# Patient Record
Sex: Male | Born: 1990 | Hispanic: Refuse to answer | Marital: Single | Smoking: Never smoker
Health system: Southern US, Community
[De-identification: ages and names within clinical notes are randomized; demographics above are authoritative.]

## PROBLEM LIST (undated history)

## (undated) DIAGNOSIS — F32A Depression, unspecified: Secondary | ICD-10-CM

## (undated) DIAGNOSIS — F329 Major depressive disorder, single episode, unspecified: Secondary | ICD-10-CM

## (undated) HISTORY — DX: Major depressive disorder, single episode, unspecified: F32.9

## (undated) HISTORY — DX: Depression, unspecified: F32.A

---

## 2014-09-09 ENCOUNTER — Ambulatory Visit (INDEPENDENT_AMBULATORY_CARE_PROVIDER_SITE_OTHER): Payer: BLUE CROSS/BLUE SHIELD | Admitting: Family Medicine

## 2014-09-09 ENCOUNTER — Encounter: Payer: Self-pay | Admitting: Family Medicine

## 2014-09-09 VITALS — BP 135/85 | HR 73 | Ht 72.0 in | Wt 139.0 lb

## 2014-09-09 DIAGNOSIS — R0602 Shortness of breath: Secondary | ICD-10-CM | POA: Diagnosis not present

## 2014-09-09 DIAGNOSIS — M549 Dorsalgia, unspecified: Secondary | ICD-10-CM | POA: Insufficient documentation

## 2014-09-09 DIAGNOSIS — L989 Disorder of the skin and subcutaneous tissue, unspecified: Secondary | ICD-10-CM | POA: Insufficient documentation

## 2014-09-09 DIAGNOSIS — M546 Pain in thoracic spine: Secondary | ICD-10-CM

## 2014-09-09 NOTE — Patient Instructions (Signed)
Whey protein: 16g-32g every day within 30 minutes after workouts to increase lean muscle or right before bed to increase overall body mass.

## 2014-09-09 NOTE — Progress Notes (Signed)
CC: Scott [name] is a 23 y.o. male is here for Establish Care and upper back pain   Subjective: HPI:  Pleasant 23 year old here to establish care  Patient complains of upper back pain and thoracic region that has been present on a daily basis for the last 3-4 months. It seems to be worse when sitting for long periods of time in chairs with poor lumbar support. It seems to be worse if he's been leaning over for a long period of time. It does not radiate. Nothing particularly makes it better or worse other than that described above. He denies any recent or remote injury or overexertion. Denies midline pain. Denies any connection of pain to points in his breathing cycle. At its worst is mild to moderate in severity.  Complains of a lesion on his left posterior shoulder that has been present for a few months but possibly years. Over the past months it has been painful to the touch. No interventions as of yet. He is worried it might be cancer.  Complains of shortness of breath that occurs at an unknown frequency. When it occurs it lasted matter of minutes and resolves if he focuses on deep and controlled breathing. He believes it may have a connection to times of anxiety. He denies any exertional chest pain or exertional shortness of breath such as when working out or climbing a flight of stairs.  Review of Systems - General ROS: negative for - chills, fever, night sweats, weight gain or weight loss Ophthalmic ROS: negative for - decreased vision Psychological ROS: negative for - anxiety or depression ENT ROS: negative for - hearing change, nasal congestion, tinnitus or allergies Hematological and Lymphatic ROS: negative for - bleeding problems, bruising or swollen lymph nodes Breast ROS: negative Respiratory ROS: no cough,  wheezing Cardiovascular ROS: no chest pain or dyspnea on exertion Gastrointestinal ROS: no abdominal pain, change in bowel habits, or black or bloody stools Genito-Urinary  ROS: negative for - genital discharge, genital ulcers, incontinence or abnormal bleeding from genitals Musculoskeletal ROS: negative for - joint pain or muscle pain other than that described above Neurological ROS: negative for - headaches or memory loss Dermatological ROS: negative for lumps, mole changes, rash and skin lesion changes other than that described above  No past medical history on file.  No past surgical history on file. No family history on file.  History   Social History  . Marital Status: Single    Spouse Name: N/A    Number of Children: N/A  . Years of Education: N/A   Occupational History  . Not on file.   Social History Main Topics  . Smoking status: Never Smoker   . Smokeless tobacco: Not on file  . Alcohol Use: No  . Drug Use: No  . Sexual Activity: Not Currently   Other Topics Concern  . Not on file   Social History Narrative  . No narrative on file     Objective: BP 135/85 mmHg  Pulse 73  Ht 6' (1.829 m)  Wt 139 lb (63.05 kg)  BMI 18.85 kg/m2  General: Alert and Oriented, No Acute Distress HEENT: Pupils equal, round, reactive to light. Conjunctivae clear.  Moist mucous membranes pharynx unremarkable Lungs: Clear to auscultation bilaterally, no wheezing/ronchi/rales.  Comfortable work of breathing. Good air movement. Cardiac: Regular rate and rhythm. Normal S1/S2.  No murmurs, rubs, nor gallops.   Right shoulder exam reveals full range of motion and strength in all planes of motion and with  individual rotator cuff testing. No overlying redness warmth or swelling.  Neer's test negative.  Hawkins test negative. Empty can negative.Apprehension test negative.  Left shoulder exam reveals full range of motion and strength in all planes of motion and with individual rotator cuff testing. No overlying redness warmth or swelling.  Neer's test negative.  Hawkins test negative. Empty can negative.Apprehension test negative.  Back: No midline spinous process  tenderness in the thoracic region, pain is reproduced with palpation of the rhomboid muscles and with firing the rhomboid muscles for scapula adduction, no scapular winging Extremities: No peripheral edema.  Strong peripheral pulses.  Mental Status: No depression, anxiety, nor agitation. Skin: Warm and dry. Fleshy slightly raised mole on the left posterior shoulder slightly erythematous without any melanotic red flags  Assessment & Plan: Scott [name] was seen today for establish care and upper back pain.  Diagnoses and associated orders for this visit:  Skin lesion of back  Upper back pain  Shortness of breath    Skin lesion of the back: Discussed that this is a benign lesion and should not cause concern for any skin cancer. Since it is painful if he would ever like it to be destructive with cryotherapy the offers on the table so to speak, he politely declines today Upper back pain: Suspect rhomboid strain bilaterally and poor scapular stabilization bilaterally. He was given a home rehabilitation plan with exercises and a resistance band to be used on a daily basis for the next 3 weeks before returning to any upper extremity strength training Shortness of breath, reassurance provided no signs on his exam of significant cardiopulmonary disease.  Return in about 3 months (around 12/09/2014).

## 2015-09-28 HISTORY — PX: WISDOM TOOTH EXTRACTION: SHX21

## 2016-01-12 ENCOUNTER — Ambulatory Visit (INDEPENDENT_AMBULATORY_CARE_PROVIDER_SITE_OTHER): Payer: BLUE CROSS/BLUE SHIELD | Admitting: Family Medicine

## 2016-01-12 ENCOUNTER — Encounter: Payer: Self-pay | Admitting: Family Medicine

## 2016-01-12 VITALS — BP 113/72 | HR 58 | Wt 142.0 lb

## 2016-01-12 DIAGNOSIS — Z0000 Encounter for general adult medical examination without abnormal findings: Secondary | ICD-10-CM | POA: Diagnosis not present

## 2016-01-12 DIAGNOSIS — Z23 Encounter for immunization: Secondary | ICD-10-CM | POA: Diagnosis not present

## 2016-01-12 LAB — COMPLETE METABOLIC PANEL WITH GFR
ALBUMIN: 4.5 g/dL (ref 3.6–5.1)
ALK PHOS: 75 U/L (ref 40–115)
ALT: 63 U/L — AB (ref 9–46)
AST: 65 U/L — AB (ref 10–40)
BILIRUBIN TOTAL: 0.4 mg/dL (ref 0.2–1.2)
BUN: 25 mg/dL (ref 7–25)
CO2: 29 mmol/L (ref 20–31)
CREATININE: 0.96 mg/dL (ref 0.60–1.35)
Calcium: 9.4 mg/dL (ref 8.6–10.3)
Chloride: 102 mmol/L (ref 98–110)
GFR, Est African American: >89 mL/min (ref >=60)
GLUCOSE: 75 mg/dL (ref 65–99)
Potassium: 4.2 mmol/L (ref 3.5–5.3)
Sodium: 137 mmol/L (ref 135–146)
TOTAL PROTEIN: 7 g/dL (ref 6.1–8.1)

## 2016-01-12 LAB — CBC
HCT: 41.6 % (ref 38.5–50.0)
HEMOGLOBIN: 13.9 g/dL (ref 13.2–17.1)
MCH: 32.5 pg (ref 27.0–33.0)
MCHC: 33.4 g/dL (ref 32.0–36.0)
MCV: 97.2 fL (ref 80.0–100.0)
MPV: 10 fL (ref 7.5–12.5)
Platelets: 207 10*3/uL (ref 140–400)
RBC: 4.28 MIL/uL (ref 4.20–5.80)
RDW: 13.3 % (ref 11.0–15.0)
WBC: 4.5 10*3/uL (ref 3.8–10.8)

## 2016-01-12 LAB — LIPID PANEL
Cholesterol: 161 mg/dL (ref 125–200)
HDL: 99 mg/dL (ref >=40)
LDL CALC: 54 mg/dL (ref <130)
TRIGLYCERIDES: 41 mg/dL (ref <150)
Total CHOL/HDL Ratio: 1.6 Ratio (ref <=5.0)
VLDL: 8 mg/dL (ref <30)

## 2016-01-12 NOTE — Addendum Note (Signed)
Addended by: Thom ChimesHENRY, Fredericka Bottcher M on: 01/12/2016 11:35 AM   Modules accepted: Orders, SmartSet

## 2016-01-12 NOTE — Progress Notes (Signed)
CC: Scott DockerJonthan [name] is a 25 y.o. male is here for Annual Exam   Subjective: HPI:  Colonoscopy: no current indication Prostate: Discussed screening risks/beneifts with patient today, no current indication.   Influenza Vaccine: UTD Pneumovax: no current indication Td/Tdap: overdue, will receive Tdap today Zoster: (Start 26 yo)  Requesting complete physical exam with no acute complaints  Review of Systems - General ROS: negative for - chills, fever, night sweats, weight gain or weight loss Ophthalmic ROS: negative for - decreased vision Psychological ROS: negative for - anxiety or depression ENT ROS: negative for - hearing change, nasal congestion, tinnitus or allergies Hematological and Lymphatic ROS: negative for - bleeding problems, bruising or swollen lymph nodes Breast ROS: negative Respiratory ROS: no cough, shortness of breath, or wheezing Cardiovascular ROS: no chest pain or dyspnea on exertion Gastrointestinal ROS: no abdominal pain, change in bowel habits, or black or bloody stools Genito-Urinary ROS: negative for - genital discharge, genital ulcers, incontinence or abnormal bleeding from genitals Musculoskeletal ROS: negative for - joint pain or muscle pain Neurological ROS: negative for - headaches or memory loss Dermatological ROS: negative for lumps, mole changes, rash and skin lesion changes  No past medical history on file.  No past surgical history on file. Family History  Problem Relation Age of Onset  . Brain cancer Maternal Grandmother   . Depression Father   . Hyperlipidemia Paternal Grandfather   . Hypertension Paternal Grandfather   . Alzheimer's disease Paternal Grandfather     Social History   Social History  . Marital Status: Single    Spouse Name: N/A  . Number of Children: N/A  . Years of Education: N/A   Occupational History  . Not on file.   Social History Main Topics  . Smoking status: Never Smoker   . Smokeless tobacco: Not on file   . Alcohol Use: No  . Drug Use: No  . Sexual Activity: Not Currently   Other Topics Concern  . Not on file   Social History Narrative  . No narrative on file     Objective: BP 113/72 mmHg  Pulse 58  Wt 142 lb (64.411 kg)  General: No Acute Distress HEENT: Atraumatic, normocephalic, conjunctivae normal without scleral icterus.  No nasal discharge, hearing grossly intact, TMs with good landmarks bilaterally with no middle ear abnormalities, posterior pharynx clear without oral lesions. Neck: Supple, trachea midline, no cervical nor supraclavicular adenopathy. Pulmonary: Clear to auscultation bilaterally without wheezing, rhonchi, nor rales. Cardiac: Regular rate and rhythm.  No murmurs, rubs, nor gallops. No peripheral edema.  2+ peripheral pulses bilaterally. Abdomen: Bowel sounds normal.  No masses.  Non-tender without rebound.  Negative Murphy's sign. MSK: Grossly intact, no signs of weakness.  Full strength throughout upper and lower extremities.  Full ROM in upper and lower extremities.  No midline spinal tenderness. Neuro: Gait unremarkable, CN II-XII grossly intact.  C5-C6 Reflex 2/4 Bilaterally, L4 Reflex 2/4 Bilaterally.  Cerebellar function intact. Skin: No rashes. Benign-appearing moles over the left scapula and on the anterior torso. Psych: Alert and oriented to person/place/time.  Thought process normal. No anxiety/depression. Assessment & Plan: Scott BandyJonthan was seen today for annual exam.  Diagnoses and all orders for this visit:  Annual physical exam   Healthy lifestyle interventions including but not limited to regular exercise, a healthy low fat diet, moderation of salt intake, the dangers of tobacco/alcohol/recreational drug use, nutrition supplementation, and accident avoidance were discussed with the patient and a handout was provided for future reference.  Return in about 1 year (around 01/11/2017), or if symptoms worsen or fail to improve.

## 2016-01-12 NOTE — Patient Instructions (Signed)
Dr. Estreya Clay's General Advice Following Your Complete Physical Exam  The Benefits of Regular Exercise: Unless you suffer from an uncontrolled cardiovascular condition, studies strongly suggest that regular exercise and physical activity will add to both the quality and length of your life.  The World Health Organization recommends 150 minutes of moderate intensity aerobic activity every week.  This is best split over 3-4 days a week, and can be as simple as a brisk walk for just over 35 minutes "most days of the week".  This type of exercise has been shown to lower LDL-Cholesterol, lower average blood sugars, lower blood pressure, lower cardiovascular disease risk, improve memory, and increase one's overall sense of wellbeing.  The addition of anaerobic (or "strength training") exercises offers additional benefits including but not limited to increased metabolism, prevention of osteoporosis, and improved overall cholesterol levels.  How Can I Strive For A Low-Fat Diet?: Current guidelines recommend that 25-35 percent of your daily energy (food) intake should come from fats.  One might ask how can this be achieved without having to dissect each meal on a daily basis?  Switch to skim or 1% milk instead of whole milk.  Focus on lean meats such as ground turkey, fresh fish, baked chicken, and lean cuts of beef as your source of dietary protein.  Limit saturated fat consumption to less than 10% of your daily caloric intake.  Limit trans fatty acid consumption primarily by limiting synthetic trans fats such as partially hydrogenated oils (Ex: fried fast foods).  Substitute olive or vegetable oil for solid fats where possible.  Moderation of Salt Intake: Provided you don't carry a diagnosis of congestive heart failure nor renal failure, I recommend a daily allowance of no more than 2300 mg of salt (sodium).  Keeping under this daily goal is associated with a decreased risk of cardiovascular events, creeping  above it can lead to elevated blood pressures and increases your risk of cardiovascular events.  Milligrams (mg) of salt is listed on all nutrition labels, and your daily intake can add up faster than you think.  Most canned and frozen dinners can pack in over half your daily salt allowance in one meal.    Lifestyle Health Risks: Certain lifestyle choices carry specific health risks.  As you may already know, tobacco use has been associated with increasing one's risk of cardiovascular disease, pulmonary disease, numerous cancers, among many other issues.  What you may not know is that there are medications and nicotine replacement strategies that can more than double your chances of successfully quitting.  I would be thrilled to help manage your quitting strategy if you currently use tobacco products.  When it comes to alcohol use, I've yet to find an "ideal" daily allowance.  Provided an individual does not have a medical condition that is exacerbated by alcohol consumption, general guidelines determine "safe drinking" as no more than two standard drinks for a man or no more than one standard drink for a male per day.  However, much debate still exists on whether any amount of alcohol consumption is technically "safe".  My general advice, keep alcohol consumption to a minimum for general health promotion.  If you or others believe that alcohol, tobacco, or recreational drug use is interfering with your life, I would be happy to provide confidential counseling regarding treatment options.  General "Over The Counter" Nutrition Advice: Postmenopausal women should aim for a daily calcium intake of 1200 mg, however a significant portion of this might already be   provided by diets including milk, yogurt, cheese, and other dairy products.  Vitamin D has been shown to help preserve bone density, prevent fatigue, and has even been shown to help reduce falls in the elderly.  Ensuring a daily intake of 800 Units of  Vitamin D is a good place to start to enjoy the above benefits, we can easily check your Vitamin D level to see if you'd potentially benefit from supplementation beyond 800 Units a day.  Folic Acid intake should be of particular concern to women of childbearing age.  Daily consumption of 400-800 mcg of Folic Acid is recommended to minimize the chance of spinal cord defects in a fetus should pregnancy occur.    For many adults, accidents still remain one of the most common culprits when it comes to cause of death.  Some of the simplest but most effective preventitive habits you can adopt include regular seatbelt use, proper helmet use, securing firearms, and regularly testing your smoke and carbon monoxide detectors.  Scott Jacquin B. Ryland Smoots DO [facility] [address], [address] [place], [place] [address] Phone: [phone]  

## 2016-01-13 ENCOUNTER — Telehealth: Payer: Self-pay | Admitting: Family Medicine

## 2016-01-13 DIAGNOSIS — R945 Abnormal results of liver function studies: Principal | ICD-10-CM

## 2016-01-13 DIAGNOSIS — R7989 Other specified abnormal findings of blood chemistry: Secondary | ICD-10-CM

## 2016-01-13 NOTE — Telephone Encounter (Signed)
Results and recommendations left on vm pt advised to call with any questions  

## 2016-01-13 NOTE — Telephone Encounter (Signed)
Will you please let patient know that his cholesterol, kidney function, blood sugar, and blood cell counts were all normal.  His liver enzymes were slightly elevated reflecting some mild liver inflammation and I'd recommend that he have a repeat blood test to make sure this is not caused by a viral infection, lab slip in your in box.

## 2016-02-02 LAB — HEPATITIS PANEL, ACUTE
HCV Ab: NEGATIVE
HEP A IGM: NONREACTIVE
Hep B C IgM: NONREACTIVE
Hepatitis B Surface Ag: NEGATIVE

## 2016-02-29 ENCOUNTER — Ambulatory Visit (INDEPENDENT_AMBULATORY_CARE_PROVIDER_SITE_OTHER): Payer: BLUE CROSS/BLUE SHIELD | Admitting: Family Medicine

## 2016-02-29 ENCOUNTER — Encounter: Payer: Self-pay | Admitting: Family Medicine

## 2016-02-29 VITALS — BP 116/76 | HR 43 | Wt 141.0 lb

## 2016-02-29 DIAGNOSIS — R945 Abnormal results of liver function studies: Principal | ICD-10-CM

## 2016-02-29 DIAGNOSIS — R7989 Other specified abnormal findings of blood chemistry: Secondary | ICD-10-CM

## 2016-02-29 NOTE — Progress Notes (Signed)
CC: Scott [name] [name] is a 25 y.o. male is here for Hepatitis   Subjective: HPI:  Follow-up hepatitis: Fortunately an acute hepatitis panel was normal. He tells me he never drinks alcohol and cannot remember the last time he took Tylenol he takes so infrequently. He continues to have no abdominal pain, fevers, diarrhea or any gastrointestinal complaints. He tells me that when he had the original test done he had engaged in strenuous workout that morning. He denies any jaundice or abdominal discomfort  Review Of Systems Outlined In HPI  No past medical history on file.  No past surgical history on file. Family History  Problem Relation Age of Onset  . Brain cancer Maternal Grandmother   . Depression Father   . Hyperlipidemia Paternal Grandfather   . Hypertension Paternal Grandfather   . Alzheimer's disease Paternal Grandfather     Social History   Social History  . Marital Status: Single    Spouse Name: N/A  . Number of Children: N/A  . Years of Education: N/A   Occupational History  . Not on file.   Social History Main Topics  . Smoking status: Never Smoker   . Smokeless tobacco: Not on file  . Alcohol Use: No  . Drug Use: No  . Sexual Activity: Not Currently   Other Topics Concern  . Not on file   Social History Narrative     Objective: BP 116/76 mmHg  Pulse 43  Wt 141 lb (63.957 kg)  Vital signs reviewed. General: Alert and Oriented, No Acute Distress HEENT: Pupils equal, round, reactive to light. Conjunctivae clear.  External ears unremarkable.  Moist mucous membranes. Lungs: Clear and comfortable work of breathing, speaking in full sentences without accessory muscle use. Cardiac: Regular rate and rhythm.  Neuro: CN II-XII grossly intact, gait normal. Extremities: No peripheral edema.  Strong peripheral pulses.  Mental Status: No depression, anxiety, nor agitation. Logical though process. Skin: Warm and dry.  Assessment & Plan: Scott [name] was seen today for  hepatitis.  Diagnoses and all orders for this visit:  Elevated LFTs -     Hepatic function panel   Very possible that his exercise routine cause him to have elevated AST and ALT, we'll test this. Today with blood work ,he hasn't exercised since Saturday morning.  No Follow-up on file.

## 2016-03-01 ENCOUNTER — Telehealth: Payer: Self-pay | Admitting: Family Medicine

## 2016-03-01 DIAGNOSIS — R945 Abnormal results of liver function studies: Principal | ICD-10-CM

## 2016-03-01 DIAGNOSIS — R7989 Other specified abnormal findings of blood chemistry: Secondary | ICD-10-CM

## 2016-03-01 LAB — HEPATIC FUNCTION PANEL
ALBUMIN: 4.4 g/dL (ref 3.6–5.1)
ALK PHOS: 68 U/L (ref 40–115)
ALT: 62 U/L — ABNORMAL HIGH (ref 9–46)
AST: 34 U/L (ref 10–40)
BILIRUBIN DIRECT: 0.1 mg/dL (ref <=0.2)
BILIRUBIN INDIRECT: 0.3 mg/dL (ref 0.2–1.2)
BILIRUBIN TOTAL: 0.4 mg/dL (ref 0.2–1.2)
Total Protein: 6.9 g/dL (ref 6.1–8.1)

## 2016-03-01 MED ORDER — VITAMIN E 180 MG (400 UNIT) PO CAPS: 400.0000 [IU] | ORAL_CAPSULE | Freq: Every day | ORAL | Status: DC

## 2016-03-01 NOTE — Telephone Encounter (Signed)
Will you please let patient know that one of his two liver enzymes is in the normal range but the second one is still mildly elevated.  I'd recommend he start a daily vitamin E supplement at a dose of 400 units, this can be obtained OTC.  Please follow up in 2-3 months to recheck blood work.

## 2016-05-31 ENCOUNTER — Ambulatory Visit (INDEPENDENT_AMBULATORY_CARE_PROVIDER_SITE_OTHER): Payer: BLUE CROSS/BLUE SHIELD | Admitting: Family Medicine

## 2016-05-31 ENCOUNTER — Encounter: Payer: Self-pay | Admitting: Family Medicine

## 2016-05-31 VITALS — BP 102/66 | HR 56 | Wt 134.0 lb

## 2016-05-31 DIAGNOSIS — R7989 Other specified abnormal findings of blood chemistry: Secondary | ICD-10-CM | POA: Diagnosis not present

## 2016-05-31 DIAGNOSIS — R945 Abnormal results of liver function studies: Principal | ICD-10-CM

## 2016-05-31 NOTE — Progress Notes (Signed)
CC: Scott [name] is a 25 y.o. male is here for liver panel   Subjective: HPI:  Since I saw him last his been taking vitamin E on a daily basis. He denies any right upper quadrant pain, jaundice, nausea or decreased appetite. He continues to work out and [employer] weights most days out of the week. Denies any unintentional weight loss   Review Of Systems Outlined In HPI  No past medical history on file.  No past surgical history on file. Family History  Problem Relation Age of Onset  . Brain cancer Maternal Grandmother   . Depression Father   . Hyperlipidemia Paternal Grandfather   . Hypertension Paternal Grandfather   . Alzheimer's disease Paternal Grandfather     Social History   Social History  . Marital status: Single    Spouse name: N/A  . Number of children: N/A  . Years of education: N/A   Occupational History  . Not on file.   Social History Main Topics  . Smoking status: Never Smoker  . Smokeless tobacco: Not on file  . Alcohol use No  . Drug use: No  . Sexual activity: Not Currently   Other Topics Concern  . Not on file   Social History Narrative  . No narrative on file     Objective: BP 102/66   Pulse (!) 56   Wt 134 lb (60.8 kg)   BMI 18.17 kg/m   Vital signs reviewed. General: Alert and Oriented, No Acute Distress HEENT: Pupils equal, round, reactive to light. Conjunctivae clear.  External ears unremarkable.  Moist mucous membranes. Lungs: Clear and comfortable work of breathing, speaking in full sentences without accessory muscle use. Cardiac: Regular rate and rhythm.  Neuro: CN II-XII grossly intact, gait normal. Extremities: No peripheral edema.  Strong peripheral pulses.  Mental Status: No depression, anxiety, nor agitation. Logical though process. Skin: Warm and dry.  Assessment & Plan: Scott [name] was seen today for liver panel.  Diagnoses and all orders for this visit:  Elevated LFTs -     Hepatic function panel   Due for repeat  LFTs, continue vitamin E pending results  Discussed with this patient that I will be resigning from my position here with [facility] in September in order to stay with my [place]. I let him know about the providers that are still accepting patients and I feel that this individual will be under great care if he/she stays here with [facility]. He's decided to see Dr. Denyse Amassorey so that he can take advantage of Dr. Zollie Peeorey's expertise with sports medicine.   Return for Within the month to see Dr. Denyse Amassorey.

## 2016-06-01 ENCOUNTER — Telehealth: Payer: Self-pay | Admitting: Family Medicine

## 2016-06-01 DIAGNOSIS — R945 Abnormal results of liver function studies: Principal | ICD-10-CM

## 2016-06-01 DIAGNOSIS — R7989 Other specified abnormal findings of blood chemistry: Secondary | ICD-10-CM

## 2016-06-01 LAB — HEPATIC FUNCTION PANEL
ALT: 84 U/L — AB (ref 9–46)
AST: 46 U/L — AB (ref 10–40)
Albumin: 4.4 g/dL (ref 3.6–5.1)
Alkaline Phosphatase: 55 U/L (ref 40–115)
BILIRUBIN DIRECT: 0.1 mg/dL (ref <=0.2)
Indirect Bilirubin: 0.4 mg/dL (ref 0.2–1.2)
Total Bilirubin: 0.5 mg/dL (ref 0.2–1.2)
Total Protein: 6.8 g/dL (ref 6.1–8.1)

## 2016-06-01 NOTE — Telephone Encounter (Signed)
Pt notified and transferred to imaging  

## 2016-06-01 NOTE — Telephone Encounter (Signed)
Will you please let patient know that his liver enzymes have not improved and it seems the Vitamin E is doing nothing for him, he may as well stop taking it.  I'd like to get an ultrasound of his liver which is part of the standard continued workup for this situation.  An order was sent to radiology.

## 2016-06-06 ENCOUNTER — Encounter: Payer: Self-pay | Admitting: Gastroenterology

## 2016-06-06 ENCOUNTER — Telehealth: Payer: Self-pay | Admitting: Family Medicine

## 2016-06-06 ENCOUNTER — Ambulatory Visit (INDEPENDENT_AMBULATORY_CARE_PROVIDER_SITE_OTHER): Payer: BLUE CROSS/BLUE SHIELD

## 2016-06-06 DIAGNOSIS — R945 Abnormal results of liver function studies: Principal | ICD-10-CM

## 2016-06-06 DIAGNOSIS — R7989 Other specified abnormal findings of blood chemistry: Secondary | ICD-10-CM | POA: Diagnosis not present

## 2016-06-06 NOTE — Telephone Encounter (Signed)
Will you please let patient know that his ultrasound was reassuring and did not show any abnormality.  Since I can't find the source of his elevated enzymes I'm going to refer him to a gastroenterologist.  Please let me know if not contacted by the end of the week.

## 2016-06-06 NOTE — Telephone Encounter (Signed)
Pt.notified

## 2016-07-18 ENCOUNTER — Ambulatory Visit: Payer: BLUE CROSS/BLUE SHIELD | Admitting: Gastroenterology

## 2016-08-22 ENCOUNTER — Ambulatory Visit (INDEPENDENT_AMBULATORY_CARE_PROVIDER_SITE_OTHER): Payer: BLUE CROSS/BLUE SHIELD | Admitting: Gastroenterology

## 2016-08-22 ENCOUNTER — Other Ambulatory Visit (INDEPENDENT_AMBULATORY_CARE_PROVIDER_SITE_OTHER): Payer: BLUE CROSS/BLUE SHIELD

## 2016-08-22 ENCOUNTER — Encounter: Payer: Self-pay | Admitting: Gastroenterology

## 2016-08-22 VITALS — BP 110/56 | HR 56 | Ht 70.25 in | Wt 128.0 lb

## 2016-08-22 DIAGNOSIS — R7989 Other specified abnormal findings of blood chemistry: Secondary | ICD-10-CM

## 2016-08-22 DIAGNOSIS — R945 Abnormal results of liver function studies: Principal | ICD-10-CM

## 2016-08-22 DIAGNOSIS — R350 Frequency of micturition: Secondary | ICD-10-CM

## 2016-08-22 LAB — CBC WITH DIFFERENTIAL/PLATELET
BASOS ABS: 0 10*3/uL (ref 0.0–0.1)
Basophils Relative: 0.5 % (ref 0.0–3.0)
EOS ABS: 0 10*3/uL (ref 0.0–0.7)
Eosinophils Relative: 0.5 % (ref 0.0–5.0)
HCT: 40.8 % (ref 39.0–52.0)
Hemoglobin: 14.3 g/dL (ref 13.0–17.0)
LYMPHS ABS: 1.9 10*3/uL (ref 0.7–4.0)
Lymphocytes Relative: 42.7 % (ref 12.0–46.0)
MCHC: 35 g/dL (ref 30.0–36.0)
MCV: 95 fl (ref 78.0–100.0)
MONO ABS: 0.4 10*3/uL (ref 0.1–1.0)
MONOS PCT: 9 % (ref 3.0–12.0)
NEUTROS PCT: 47.3 % (ref 43.0–77.0)
Neutro Abs: 2.1 10*3/uL (ref 1.4–7.7)
Platelets: 206 10*3/uL (ref 150.0–400.0)
RBC: 4.29 Mil/uL (ref 4.22–5.81)
RDW: 13.2 % (ref 11.5–15.5)
WBC: 4.5 10*3/uL (ref 4.0–10.5)

## 2016-08-22 LAB — TSH: TSH: 1.59 u[IU]/mL (ref 0.35–4.50)

## 2016-08-22 LAB — BASIC METABOLIC PANEL
BUN: 22 mg/dL (ref 6–23)
CALCIUM: 9.5 mg/dL (ref 8.4–10.5)
CO2: 30 meq/L (ref 19–32)
Chloride: 100 mEq/L (ref 96–112)
Creatinine, Ser: 1.28 mg/dL (ref 0.40–1.50)
GFR: 71.91 mL/min (ref >60.00)
GLUCOSE: 81 mg/dL (ref 70–99)
Potassium: 4.5 mEq/L (ref 3.5–5.1)
SODIUM: 138 meq/L (ref 135–145)

## 2016-08-22 LAB — HEPATIC FUNCTION PANEL
ALBUMIN: 4.7 g/dL (ref 3.5–5.2)
ALK PHOS: 45 U/L (ref 39–117)
ALT: 46 U/L (ref 0–53)
AST: 36 U/L (ref 0–37)
Bilirubin, Direct: 0.1 mg/dL (ref 0.0–0.3)
TOTAL PROTEIN: 7.3 g/dL (ref 6.0–8.3)
Total Bilirubin: 0.5 mg/dL (ref 0.2–1.2)

## 2016-08-22 LAB — IGA: IgA: 206 mg/dL (ref 68–378)

## 2016-08-22 LAB — IBC PANEL
Iron: 92 ug/dL (ref 42–165)
SATURATION RATIOS: 26.8 % (ref 20.0–50.0)
TRANSFERRIN: 245 mg/dL (ref 212.0–360.0)

## 2016-08-22 LAB — FERRITIN: Ferritin: 47.2 ng/mL (ref 22.0–322.0)

## 2016-08-22 NOTE — Patient Instructions (Addendum)
Your physician has requested that you go to the basement lab work before leaving today.   Thank you for choosing me and [facility].  Venita LickMalcolm T. Pleas KochStark, Jr., MD., Clementeen GrahamFACG

## 2016-08-22 NOTE — Progress Notes (Signed)
    History of Present Illness: This is a 25 year old male referred by Laren BoomHommel, Sean, DO for the evaluation of elevated LFTs. Mild transaminase elevations have been noted since April. Patient started taking Wellbutrin in February and Zoloft in October. Denies any other medication usage. He denies the use of any OTC medications or supplements. Patient denies any personal or family history of liver disease. Last LFTs in August: AST=46, ALT=84. Abdominal ultrasound in September did not show any liver or biliary abnormalities. He complains of increased urination particularly at night and increased thirst but otherwise feels well. Denies weight loss, abdominal pain, constipation, diarrhea, change in stool caliber, melena, hematochezia, nausea, vomiting, dysphagia, reflux symptoms, chest pain.   No Known Allergies Outpatient Medications Prior to Visit  Medication Sig Dispense Refill  . buPROPion (WELLBUTRIN SR) 150 MG 12 hr tablet Take 150 mg by mouth 2 (two) times daily.  0   No facility-administered medications prior to visit.    Past Medical History:  Diagnosis Date  . Depression    History reviewed. No pertinent surgical history. Social History   Social History  . Marital status: Single    Spouse name: N/A  . Number of children: N/A  . Years of education: N/A   Social History Main Topics  . Smoking status: Never Smoker  . Smokeless tobacco: Never Used  . Alcohol use No  . Drug use: No  . Sexual activity: Not Currently   Other Topics Concern  . None   Social History Narrative  . None   Family History  Problem Relation Age of Onset  . Brain cancer Maternal Grandmother   . Depression Father   . Hyperlipidemia Paternal Grandfather   . Hypertension Paternal Grandfather   . Alzheimer's disease Paternal Grandfather      Review of Systems: Pertinent positive and negative review of systems were noted in the above HPI section. All other review of systems were otherwise  negative.   Physical Exam: General: Well developed, well nourished, no acute distress Head: Normocephalic and atraumatic Eyes:  sclerae anicteric, EOMI Ears: Normal auditory acuity Mouth: No deformity or lesions Neck: Supple, no masses or thyromegaly Lungs: Clear throughout to auscultation Heart: Regular rate and rhythm; no murmurs, rubs or bruits Abdomen: Soft, non tender and non distended. No masses, hepatosplenomegaly or hernias noted. Normal Bowel sounds Musculoskeletal: Symmetrical with no gross deformities  Skin: No lesions on visible extremities Pulses:  Normal pulses noted Extremities: No clubbing, cyanosis, edema or deformities noted Neurological: Alert oriented x 4, grossly nonfocal Cervical Nodes:  No significant cervical adenopathy Inguinal Nodes: No significant inguinal adenopathy Psychological:  Alert and cooperative. Normal mood and affect  Assessment and Recommendations:  1. Elevated transaminases. Etiology unclear. Possibly medication induced. Send all standard hepatic serologies. Repeat LFTs today. REV in 2 months.   2. Increased urination, particularly at night and increased thirst. CMP, TSH today. Follow up with PCP.    cc: Laren BoomSean Hommel, DO No address on file

## 2016-08-23 LAB — ANGIOTENSIN CONVERTING ENZYME: ANGIOTENSIN-CONVERTING ENZYME: 31 U/L (ref 9–67)

## 2016-08-23 LAB — ANA: Anti Nuclear Antibody(ANA): NEGATIVE

## 2016-08-23 LAB — MITOCHONDRIAL ANTIBODIES

## 2016-08-23 LAB — TISSUE TRANSGLUTAMINASE, IGA: Tissue Transglutaminase Ab, IgA: 1 U/mL (ref <4)

## 2016-08-24 LAB — ANTI-SMOOTH MUSCLE ANTIBODY, IGG: Smooth Muscle Ab: <20 U (ref <20)

## 2016-08-28 LAB — ALPHA-1-ANTITRYPSIN: A-1 Antitrypsin, Ser: 77 mg/dL — ABNORMAL LOW (ref 83–199)

## 2016-08-28 LAB — CERULOPLASMIN: Ceruloplasmin: 15 mg/dL — ABNORMAL LOW (ref 18–36)

## 2016-09-01 ENCOUNTER — Other Ambulatory Visit: Payer: Self-pay

## 2016-09-01 DIAGNOSIS — R945 Abnormal results of liver function studies: Secondary | ICD-10-CM

## 2016-09-01 DIAGNOSIS — R7989 Other specified abnormal findings of blood chemistry: Secondary | ICD-10-CM

## 2016-09-28 IMAGING — US US ABDOMEN COMPLETE
1 series · 14 of 25 positions shown · non-contrast
Comparison: No recent prior.

CLINICAL DATA: Elevated liver enzymes.

EXAM:
ABDOMEN ULTRASOUND COMPLETE

[Series 1: us abdomen complete · 0.17mm/px · 14 of 64 slices shown]
[im 1/64]
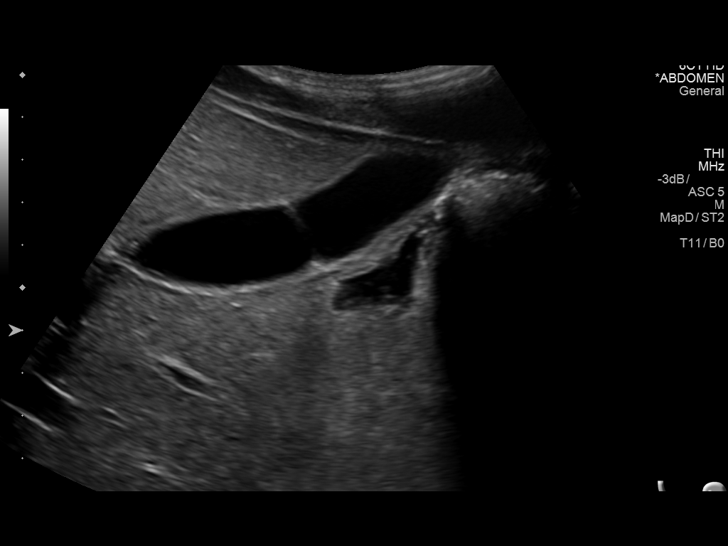
[im 6/64]
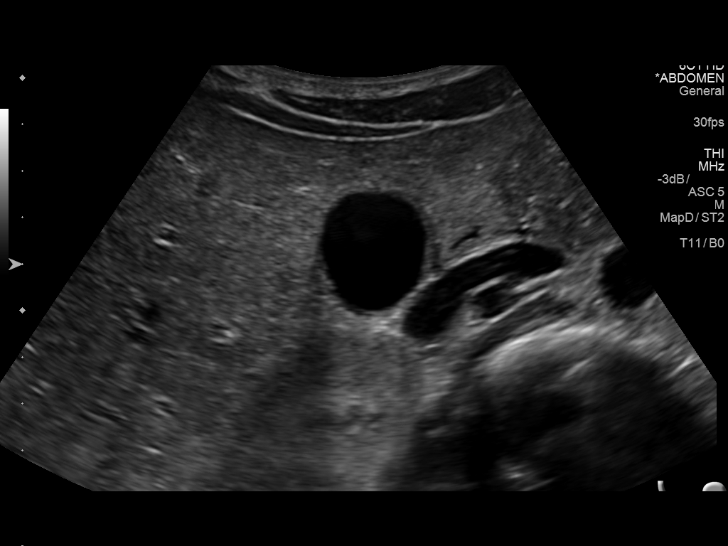
[im 11/64]
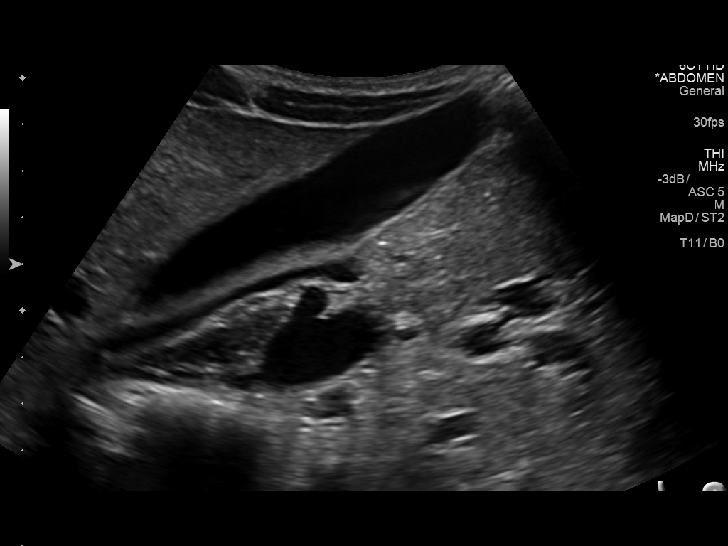
[im 16/64]
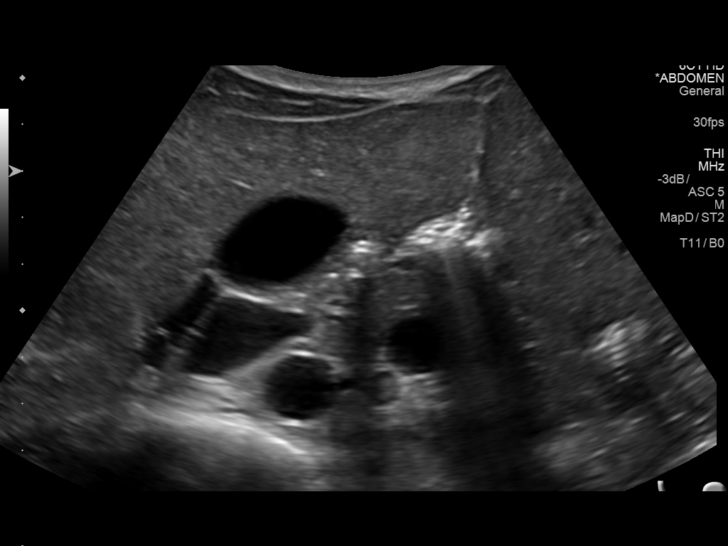
[im 22/64]
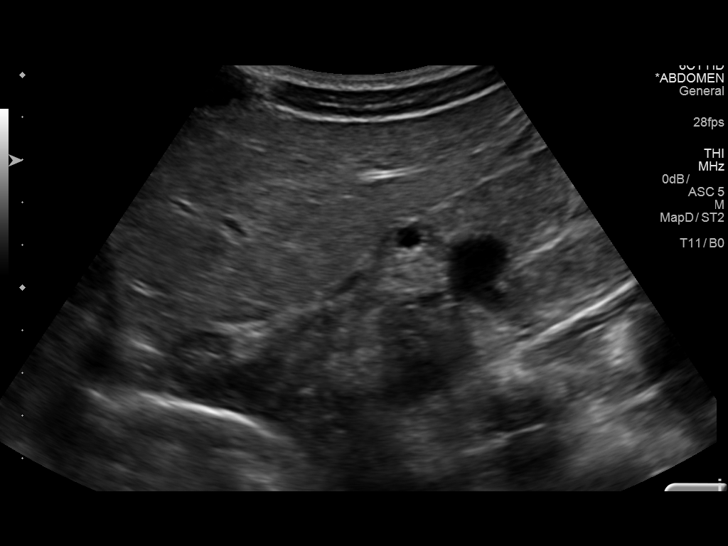
[im 24/64]
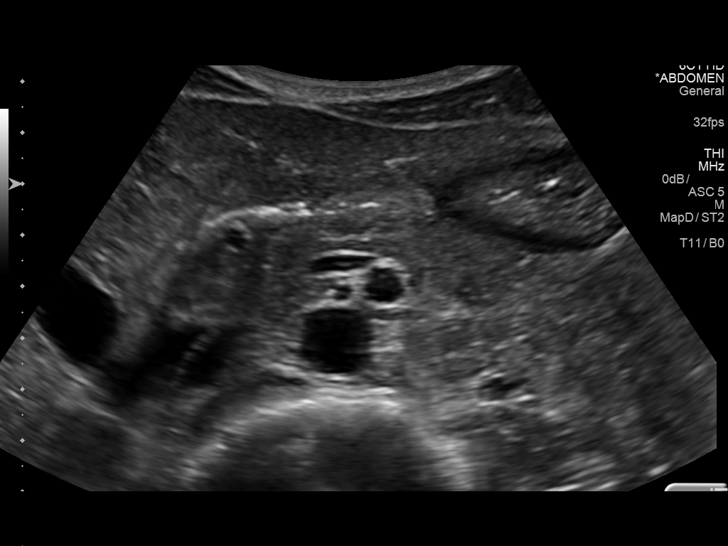
[im 29/64]
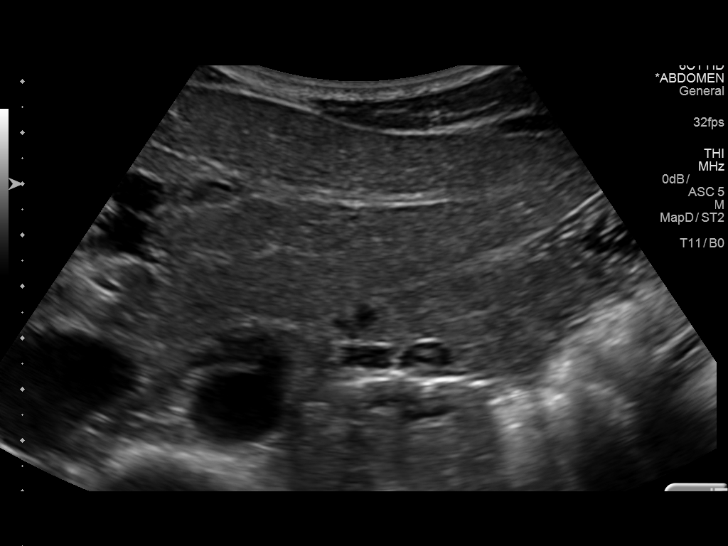
[im 35/64]
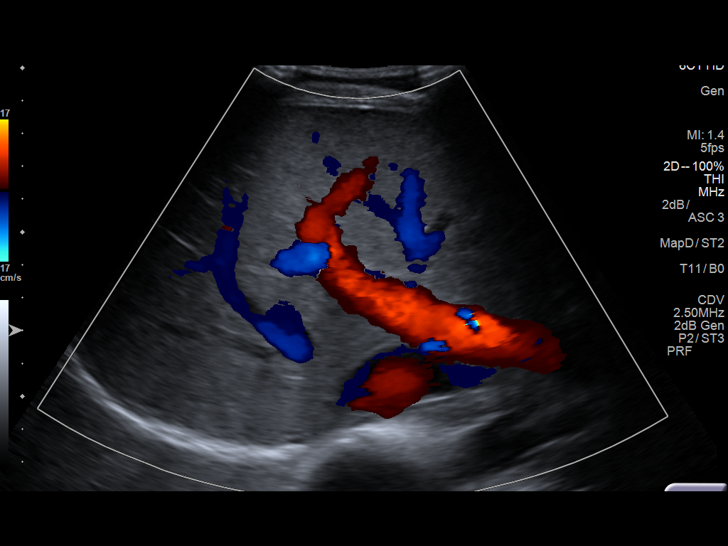
[im 40/64]
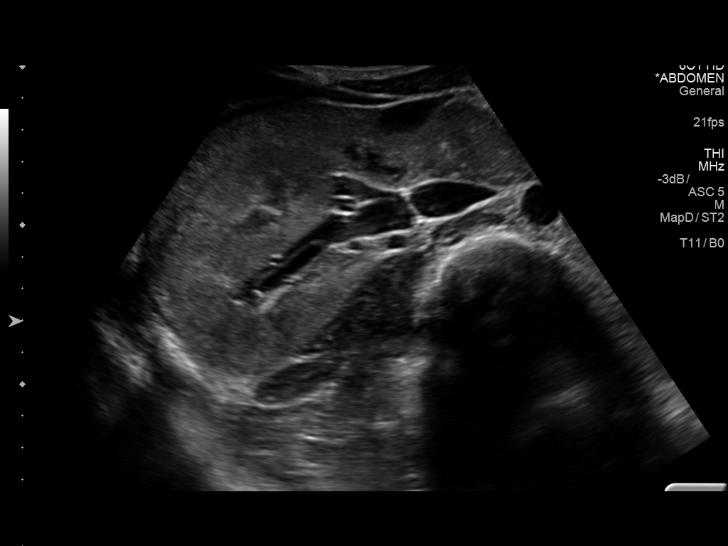
[im 43/64]
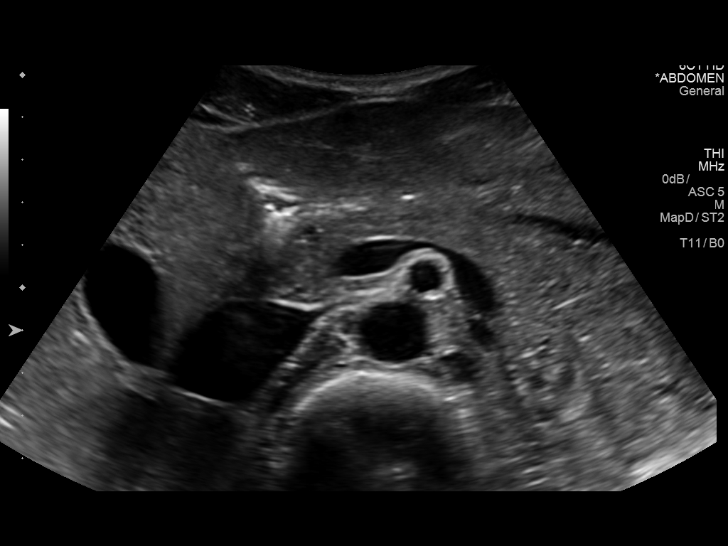
[im 48/64]
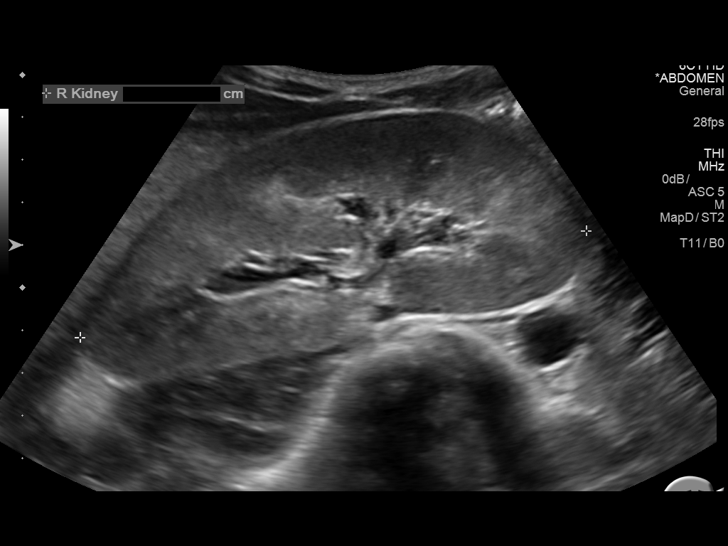
[im 53/64]
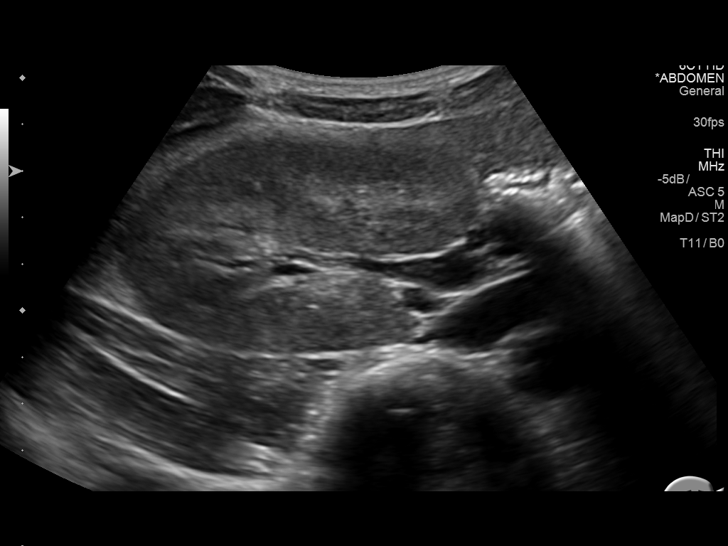
[im 58/64]
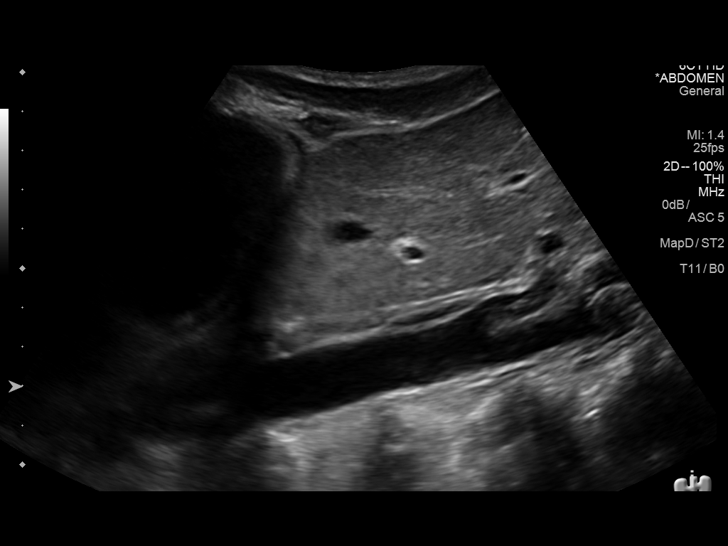
[im 64/64]
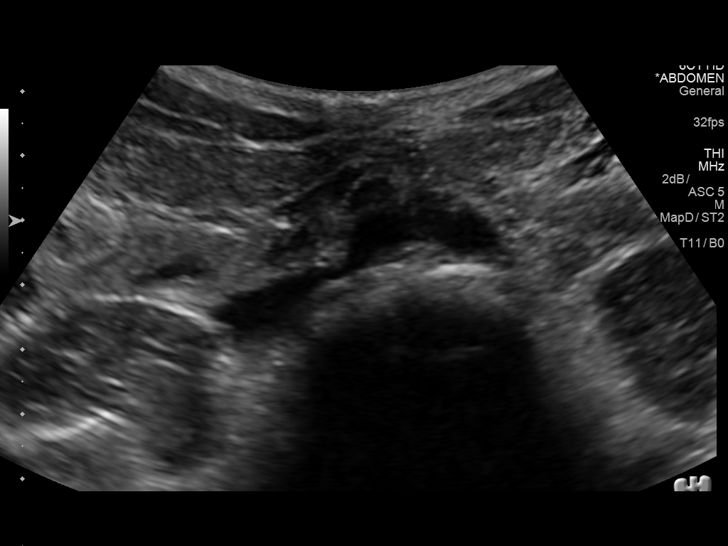

[14 of 25 positions shown; findings below may reference images not displayed]

FINDINGS: Gallbladder: No gallstones or wall thickening visualized. No
sonographic Murphy sign noted by sonographer.

Common bile duct: Diameter: 2.3 mm

Liver: No focal lesion identified. Within normal limits in
parenchymal echogenicity.

IVC: No abnormality visualized.

Pancreas: Visualized portion unremarkable.

Spleen: Obscured by overlying scratched bowel gas.

Right Kidney: Length: 12.1 cm. Echogenicity within normal limits. No
mass or hydronephrosis visualized.

Left Kidney:

Obscured by overlying stool and bowel gas.

Abdominal aorta: No aneurysm visualized.

Other findings: None.
IMPRESSION: Negative exam. Left upper quadrant difficult to evaluate due to
overlying stool and bowel gas.

## 2016-10-24 ENCOUNTER — Other Ambulatory Visit (INDEPENDENT_AMBULATORY_CARE_PROVIDER_SITE_OTHER): Payer: BLUE CROSS/BLUE SHIELD

## 2016-10-24 ENCOUNTER — Encounter: Payer: Self-pay | Admitting: Gastroenterology

## 2016-10-24 ENCOUNTER — Ambulatory Visit (INDEPENDENT_AMBULATORY_CARE_PROVIDER_SITE_OTHER): Payer: BLUE CROSS/BLUE SHIELD | Admitting: Gastroenterology

## 2016-10-24 VITALS — BP 90/68 | HR 60 | Ht 70.0 in | Wt 128.4 lb

## 2016-10-24 DIAGNOSIS — R7989 Other specified abnormal findings of blood chemistry: Secondary | ICD-10-CM

## 2016-10-24 DIAGNOSIS — R945 Abnormal results of liver function studies: Secondary | ICD-10-CM

## 2016-10-24 LAB — HEPATIC FUNCTION PANEL
ALT: 33 U/L (ref 0–53)
AST: 29 U/L (ref 0–37)
Albumin: 4.7 g/dL (ref 3.5–5.2)
Alkaline Phosphatase: 46 U/L (ref 39–117)
BILIRUBIN DIRECT: 0.1 mg/dL (ref 0.0–0.3)
BILIRUBIN TOTAL: 0.5 mg/dL (ref 0.2–1.2)
Total Protein: 7.5 g/dL (ref 6.0–8.3)

## 2016-10-24 NOTE — Progress Notes (Signed)
    History of Present Illness: This is a 26 year old m55ale returning for follow-up of elevated transaminases. Repeat liver enzymes drawn at the time of hepatic serologic evaluation were unremarkable. 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 He has not yet proceeded with the 24-hour urine copper collection or A-1 antitrypsin phenotype testing. He had his wisdom teeth removed several weeks ago and had a good recovery. No gastrointestinal complaints.  Current Medications, Allergies, Past Medical History, Past Surgical History, Family History and Social History were reviewed in [facility] electronic medical record.  Physical Exam: General: Well developed, well nourished, no acute distress Head: Normocephalic and atraumatic Eyes:  sclerae anicteric, EOMI Ears: Normal auditory acuity Mouth: No deformity or lesions Lungs: Clear throughout to auscultation Heart: Regular rate and rhythm; no murmurs, rubs or bruits Abdomen: Soft, non tender and non distended. No masses, hepatosplenomegaly or hernias noted. Normal Bowel sounds Musculoskeletal: Symmetrical with no gross deformities  Pulses:  Normal pulses noted Extremities: No clubbing, cyanosis, edema or deformities noted Neurological: Alert oriented x 4, grossly nonfocal Psychological:  Alert and cooperative. Normal mood and affect  Assessment and Recommendations:  1. Elevated transaminases, resolved. A-1 antitrypsin and ceruloplasmin are both abnormally low which may not be clinically significant. Wilson's disease and A1-antitrypsin deficiency need to be excluded. Phenotype for AAT and 24 urine copper studies were recommended in early December however the patient has not yet completed these. We discussed the recommended evaluation and he is agreeable to proceed. Repeat LFTs today as well. We will contact him with the results of above testing and make further plans pending results.  I spent 15 minutes of face-to-face  time with the patient. Greater than 50% of the time was spent counseling and coordinating care.

## 2016-10-24 NOTE — Patient Instructions (Signed)
Your physician has requested that you go to the basement for lab work before leaving today.  Thank you for choosing me and [facility].  Malcolm T. Stark, Jr., MD., FACG  

## 2016-10-28 LAB — ALPHA-1 ANTITRYPSIN PHENOTYPE: A1 ANTITRYPSIN: 97 mg/dL (ref 83–199)

## 2016-11-02 ENCOUNTER — Other Ambulatory Visit: Payer: BLUE CROSS/BLUE SHIELD

## 2016-11-02 DIAGNOSIS — R945 Abnormal results of liver function studies: Secondary | ICD-10-CM

## 2016-11-02 DIAGNOSIS — R7989 Other specified abnormal findings of blood chemistry: Secondary | ICD-10-CM

## 2016-11-10 LAB — COPPER, URINE, 24 HOUR
CREATININE, URINE MG/DAY-CUURI: 1.19 g/(24.h) (ref 0.63–2.50)
Copper,Urine (24 Hr): 4 mcg/L (ref 2–30)

## 2017-02-23 ENCOUNTER — Ambulatory Visit (INDEPENDENT_AMBULATORY_CARE_PROVIDER_SITE_OTHER): Payer: BLUE CROSS/BLUE SHIELD | Admitting: Family Medicine

## 2017-02-23 DIAGNOSIS — M76899 Other specified enthesopathies of unspecified lower limb, excluding foot: Secondary | ICD-10-CM | POA: Insufficient documentation

## 2017-02-23 MED ORDER — NITROGLYCERIN 0.2 MG/HR TD PT24: MEDICATED_PATCH | TRANSDERMAL | 1 refills | Status: DC

## 2017-02-23 NOTE — Progress Notes (Signed)
Scott [name] is a 26 y.o. male who presents to [facility] today for left knee pain. Patient notes a one-month history of left anterior knee pain. The pain is worse with running and better with rest. Additionally has some pain with leg press. The pain is located at the lateral aspect of the posterior patella. He denies any injury. No fevers or chills. He feels well otherwise.   Past Medical History:  Diagnosis Date  . Depression    Past Surgical History:  Procedure Laterality Date  . WISDOM TOOTH EXTRACTION  09/28/2015   4 teeth extracted   Social History  Substance Use Topics  . Smoking status: Never Smoker  . Smokeless tobacco: Never Used  . Alcohol use No     ROS:  As above   Medications: Current Outpatient Prescriptions  Medication Sig Dispense Refill  . nitroGLYCERIN (NITRODUR - DOSED IN MG/24 HR) 0.2 mg/hr patch Apply 1/4 patch to leg for tendonitis daily 30 patch 1   No current facility-administered medications for this visit.    No Known Allergies   Exam:  BP (!) 102/53   Pulse (!) 56   Wt 139 lb (63 kg)   BMI 19.94 kg/m  General: Well Developed, well nourished, and in no acute distress.  Neuro/Psych: Alert and oriented x3, extra-ocular muscles intact, able to move all 4 extremities, sensation grossly intact. Skin: Warm and dry, no rashes noted.  Respiratory: Not using accessory muscles, speaking in full sentences, trachea midline.  Cardiovascular: Pulses palpable, no extremity edema. Abdomen: Does not appear distended. MSK: Left knee is unremarkable appearing with no obvious effusion. He is somewhat tender to palpation at the superior lateral corner of the patella at the insertion of the quadriceps tendon. He has mild pain with resisted extension.  Limited Musculoskeletal ultrasound of the left knee reveals mild effusion and increased vascular activity in the quadriceps tendon indicating quadriceps  tendinopathy Multiple bony structures.      Assessment and Plan: 26 y.o. male with left knee pain likely quadriceps tendinopathy. X-ray pending to rule out bony abnormalities this patient does have a mild effusion. Plan for treatment with nitroglycerin patch protocol as well as eccentric exercises. Recheck in 6 weeks.    Orders Placed This Encounter  Procedures  . DG Knee 1-2 Views Right    Standing Status:   Future    Standing Expiration Date:   04/26/2018    Order Specific Question:   Reason for Exam (SYMPTOM  OR DIAGNOSIS REQUIRED)    Answer:   For use with the left knee x-ray bilateral AP and Rosenberg standing.    Order Specific Question:   Preferred imaging location?    Answer:   [facility]  . DG Knee Complete 4 Views Left    Please include patellar sunrise, lateral, and weightbearing bilateral AP and bilateral rosenberg views    Standing Status:   Future    Standing Expiration Date:   04/25/2018    Order Specific Question:   Reason for exam:    Answer:   Please include patellar sunrise, lateral, and weightbearing bilateral AP and bilateral rosenberg views    Comments:   Please include patellar sunrise, lateral, and weightbearing bilateral AP and bilateral rosenberg views    Order Specific Question:   Preferred imaging location?    Answer:   [facility]   Meds ordered this encounter  Medications  . nitroGLYCERIN (NITRODUR - DOSED IN MG/24 HR) 0.2 mg/hr patch  Sig: Apply 1/4 patch to leg for tendonitis daily    Dispense:  30 patch    Refill:  1    Discussed warning signs or symptoms. Please see discharge instructions. Patient expresses understanding.

## 2017-02-23 NOTE — Patient Instructions (Addendum)
Thank you for coming in today. Work on leg extension going down slowly with resistance.  You can also use an elastic band as well.   Nitroglycerin Protocol   Apply 1/4 nitroglycerin patch to affected area daily.  Change position of patch within the affected area every 24 hours.  You may experience a headache during the first 1-2 weeks of using the patch, these should subside.  If you experience headaches after beginning nitroglycerin patch treatment, you may take your preferred over the counter pain reliever.  Another side effect of the nitroglycerin patch is skin irritation or rash related to patch adhesive.  Please notify our office if you develop more severe headaches or rash, and stop the patch.  Tendon healing with nitroglycerin patch may require 12 to 24 weeks depending on the extent of injury.  Men should not use if taking Viagra, Cialis, or Levitra.   Do not use if you have migraines or rosacea.      Quadriceps Tendon Tear or Disruption Rehab Ask your health care provider which exercises are safe for you. Do exercises exactly as told by your health care provider and adjust them as directed. It is normal to feel mild stretching, pulling, tightness, or discomfort as you do these exercises, but you should stop right away if you feel sudden pain or your pain gets worse.Do not begin these exercises until told by your health care provider. Stretching and range of motion exercises These exercises warm up your muscles and joints and improve the movement and flexibility of your thigh. These exercises can also help to relieve pain, numbness, and tingling. Exercise A: Knee flexion, active   10. Lie on your back with both knees straight. If this causes back discomfort, bend your healthy knee so your foot is flat on the floor. 11. Slowly slide your left / right heel back toward your buttocks until you feel a gentle stretch in the front of your knee or thigh. 12. Hold for __________  seconds. 13. Slowly slide your left / right heel back to the starting position. Repeat __________ times. Complete this exercise __________ times a day. Exercise B: Knee extension, passive   1. Sit with your left / right heel propped on a chair, a coffee table, or a footstool. Do not have anything under your knee to support it. 2. Allow your leg muscles to relax, letting gravity straighten out your knee. You should feel a stretch behind your left / right knee. 3. If told by your health care provider, deepen the stretch by placing a __________ weight on your thigh, just above your kneecap. 4. Hold this position for __________ seconds. Repeat __________ times. Complete this exercise __________ times a day. Strengthening exercises These exercises build strength and endurance in your thigh. Endurance is the ability to use your muscles for a long time, even after your muscles get tired. Do these exercises while wearing your brace if told by your health care provider. Exercise C: Quadriceps, isometric   1. Lie on your back with your left / right leg extended and your other knee bent. If told by your health care provider, put a small pillow or rolled towel under your extended knee. 2. Gradually tense the muscles in the front of your left / right thigh. You should see your kneecap slide up toward your hip or see increased dimpling just above the knee. This motion will push the back of your knee down toward the surface that is under it. 3. For __________  seconds, hold the muscle as tight as you can without increasing your pain. 4. Relax the muscles slowly and completely in between repetitions. Repeat __________ times. Complete this exercise __________ times a day. Exercise D: Straight leg raises (  hip flexors and quadriceps) 1. Lie on your back with your left / right leg extended and your other knee bent. 2. Tense the muscles in the front of your left / right thigh. You should see your kneecap slide up  or see increased dimpling just above the knee. 3. Keep these muscles tight while you raise your leg 4-6 inches (10-15 cm) off the floor. Do not let your knee bend. 4. Hold the position for __________ seconds. 5. Keep the muscles tense and keep your knee straight as you lower your leg. 6. Relax the muscles slowly and completely after each repetition. Repeat __________ times. Complete this exercise __________ times a day. Exercise E: Wall slides ( knee and hip extensors) 1. Lean your back against a smooth wall or door and walk your feet out 18-24 inches (46-61 cm) from it. Place your feet hip-width apart. 2. Slowly slide down the wall or door until your knees bend __________ degrees. Do not bend your knee farther than told by your health care provider. Keep your knees over your heels, not over your toes. Keep your knees in line with your hips. 3. Hold for __________ seconds. Stand up to rest for __________ seconds in between repetitions. Repeat __________ times. Complete this exercise __________ times a day. This information is not intended to replace advice given to you by your health care provider. Make sure you discuss any questions you have with your health care provider. Document Released: 09/18/2005 Document Revised: 05/23/2016 Document Reviewed: 08/31/2015 Elsevier Interactive Patient Education  2017 [employer].

## 2019-03-05 ENCOUNTER — Encounter: Payer: Self-pay | Admitting: Family Medicine

## 2019-03-05 ENCOUNTER — Telehealth (INDEPENDENT_AMBULATORY_CARE_PROVIDER_SITE_OTHER): Payer: BLUE CROSS/BLUE SHIELD | Admitting: Family Medicine

## 2019-03-05 VITALS — Ht 72.0 in | Wt 148.0 lb

## 2019-03-05 DIAGNOSIS — Z566 Other physical and mental strain related to work: Secondary | ICD-10-CM | POA: Diagnosis not present

## 2019-03-05 NOTE — Progress Notes (Signed)
Virtual Visit  via Video Note  I connected with      Scott [name] by a video enabled telemedicine application and verified that I am speaking with the correct person using two identifiers.   I discussed the limitations of evaluation and management by telemedicine and the availability of in person appointments. The patient expressed understanding and agreed to proceed.  History of Present Illness: Scott [name] is a 28 y.o. adult who would like to discuss COVID-19 concerns.  Scott [name] works at [employer] and has been out of work since [place] pandemic.  However his work is encouraging him to return to work now.  However he lives with his elderly parents who are sick.  He is very concerned about potentially contracting COVID-19 and transmitted it to his parents who are likely to have bad outcomes.  He notes that he may benefit from a letter saying that he should be able to work from home or avoid work in the circumstances.  He does not think he has had COVID-19 yet nor his parents.  He feels well otherwise with no fevers or chills.     Observations/Objective: Ht 6' (1.829 m)   Wt 148 lb (67.1 kg)   BMI 20.07 kg/m  Wt Readings from Last 5 Encounters:  03/05/19 148 lb (67.1 kg)  02/23/17 139 lb (63 kg)  10/24/16 128 lb 6.4 oz (58.2 kg)  08/22/16 128 lb (58.1 kg)  05/31/16 134 lb (60.8 kg)   Exam: Appearance Normal Speech thought process and affect.  Lab and Radiology Results No results found for this or any previous visit (from the past 72 hour(s)). No results found.   Assessment and Plan: 28 y.o. adult with COVID-19 concerns.  Patient has reasonable concerns.  I think it is okay to work from home or avoid exposures if possible.  His parents certainly are high risk and he lives closely with them.  Letter provided and recheck as needed.  PDMP not reviewed this encounter. No orders of the defined types were placed in this encounter.  No orders of the defined types were placed in  this encounter.   Follow Up Instructions:    I discussed the assessment and treatment plan with the patient. The patient was provided an opportunity to ask questions and all were answered. The patient agreed with the plan and demonstrated an understanding of the instructions.   The patient was advised to call back or seek an in-person evaluation if the symptoms worsen or if the condition fails to improve as anticipated.  Time: 15 minutes of intraservice time, with >22 minutes of total time during today's visit.      Historical information moved to improve visibility of documentation.  Past Medical History:  Diagnosis Date  . Depression    Past Surgical History:  Procedure Laterality Date  . WISDOM TOOTH EXTRACTION  09/28/2015   4 teeth extracted   Social History   Tobacco Use  . Smoking status: Never Smoker  . Smokeless tobacco: Never Used  Substance Use Topics  . Alcohol use: No    Alcohol/week: 0.0 standard drinks   family history includes Alzheimer's disease in his paternal grandfather; Brain cancer in his maternal grandmother; Depression in his father; Hyperlipidemia in his paternal grandfather; Hypertension in his paternal grandfather.  Medications: No current outpatient medications on file.   No current facility-administered medications for this visit.    No Known Allergies
